# Patient Record
Sex: Male | Born: 2005 | Race: White | Hispanic: No | Marital: Single | Smoking: Never smoker
Health system: Southern US, Community
[De-identification: ages and names within clinical notes are randomized; demographics above are authoritative.]

## PROBLEM LIST (undated history)

## (undated) DIAGNOSIS — F909 Attention-deficit hyperactivity disorder, unspecified type: Secondary | ICD-10-CM

---
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
## 2023-12-14 ENCOUNTER — Other Ambulatory Visit: Payer: Self-pay

## 2023-12-14 ENCOUNTER — Emergency Department
Admission: EM | Admit: 2023-12-14 | Discharge: 2023-12-14 | Disposition: A | Discharge status: Home / Self Care | Attending: Emergency Medicine | Admitting: Emergency Medicine

## 2023-12-14 ENCOUNTER — Emergency Department

## 2023-12-14 DIAGNOSIS — S61213A Laceration without foreign body of left middle finger without damage to nail, initial encounter: Secondary | ICD-10-CM

## 2023-12-14 DIAGNOSIS — W293XXA Contact with powered garden and outdoor hand tools and machinery, initial encounter: Secondary | ICD-10-CM | POA: Diagnosis not present

## 2023-12-14 DIAGNOSIS — S61211A Laceration without foreign body of left index finger without damage to nail, initial encounter: Secondary | ICD-10-CM | POA: Insufficient documentation

## 2023-12-14 DIAGNOSIS — S62633B Displaced fracture of distal phalanx of left middle finger, initial encounter for open fracture: Secondary | ICD-10-CM | POA: Diagnosis not present

## 2023-12-14 DIAGNOSIS — Z23 Encounter for immunization: Secondary | ICD-10-CM | POA: Insufficient documentation

## 2023-12-14 DIAGNOSIS — T148XXA Other injury of unspecified body region, initial encounter: Secondary | ICD-10-CM

## 2023-12-14 MED ORDER — AMOXICILLIN-POT CLAVULANATE 875-125 MG PO TABS: 1.0000 | ORAL_TABLET | Freq: Two times a day (BID) | ORAL | 0 refills | Status: AC

## 2023-12-14 MED ORDER — LIDOCAINE HCL (PF) 1 % IJ SOLN
5.0000 mL | Freq: Once | INTRAMUSCULAR | Status: AC
  Administered 2023-12-14: 5 mL
  Filled 2023-12-14: qty 5

## 2023-12-14 MED ORDER — TETANUS-DIPHTH-ACELL PERTUSSIS 5-2.5-18.5 LF-MCG/0.5 IM SUSY
0.5000 mL | PREFILLED_SYRINGE | Freq: Once | INTRAMUSCULAR | Status: AC
  Administered 2023-12-14: 0.5 mL via INTRAMUSCULAR
  Filled 2023-12-14 (×2): qty 0.5

## 2023-12-14 MED ORDER — IBUPROFEN 600 MG PO TABS: 600.0000 mg | ORAL_TABLET | Freq: Three times a day (TID) | ORAL | 0 refills | Status: AC | PRN

## 2023-12-14 MED ORDER — AMOXICILLIN-POT CLAVULANATE 875-125 MG PO TABS
1.0000 | ORAL_TABLET | Freq: Once | ORAL | Status: AC
  Administered 2023-12-14: 1 via ORAL
  Filled 2023-12-14: qty 1

## 2023-12-14 MED ORDER — IBUPROFEN 600 MG PO TABS
600.0000 mg | ORAL_TABLET | Freq: Once | ORAL | Status: AC
  Administered 2023-12-14: 600 mg via ORAL
  Filled 2023-12-14: qty 1

## 2023-12-14 NOTE — ED Triage Notes (Signed)
 Pt to ED via POV c/o laceration to left middle finger. Pt was sawing something when he caught his finger. Pt has 2 lact to middle finger, not bleeding at this time. Pt also has small lac to index finger.
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
## 2023-12-14 NOTE — ED Provider Notes (Signed)
 [facility] Provider Note    Event Date/Time   First MD Initiated Contact with Patient 12/14/23 1949     (approximate)   History   Finger Injury    HPI  Kristopher Russell is a 18 y.o. male , with no significant past medical history who presents to the ED complaining of left middle finger laceration. According to the patient he was using chainsaw cutting wood and cut his finger.      Physical Exam   Triage Vital Signs: ED Triage Vitals  Encounter Vitals Group     BP 12/14/23 1936 (!) 144/90     Systolic BP Percentile --      Diastolic BP Percentile --      Pulse Rate 12/14/23 1936 93     Resp 12/14/23 1936 16     Temp 12/14/23 1936 (!) 97.5 F (36.4 C)     Temp src --      SpO2 12/14/23 1936 100 %     Weight 12/14/23 1935 185 lb (83.9 kg)     Height 12/14/23 1935 5\' 11"  (1.803 m)     Head Circumference --      Peak Flow --      Pain Score 12/14/23 1935 0     Pain Loc --      Pain Education --      Exclude from Growth Chart --     Most recent vital signs: Vitals:   12/14/23 1936  BP: (!) 144/90  Pulse: 93  Resp: 16  Temp: (!) 97.5 F (36.4 C)  SpO2: 100%     Constitutional: Alert, NAD. Able to speak in complete sentences without cough or dyspnea  Eyes: Conjunctivae are normal.  Head: Atraumatic. Nose: No congestion/rhinnorhea. Mouth/Throat: Mucous membranes are moist.   Neck: Painless ROM. Supple. No JVD, nodes, thyromegaly  Cardiovascular:   Good peripheral circulation.RRR no murmurs, gallops, rubs  Respiratory: Normal respiratory effort.  No retractions. Clear to auscultation bilaterally without wheezing or crackles  Gastrointestinal: Soft and nontender.  Musculoskeletal:  no deformity Left hand: Left middle finger: Presence of palmar laceration with avulsion of the skin the second phalange in the dorsal area, no active bleeding.  Second laceration medial area distal phalange.  Active bleeding Neurologic:  MAE spontaneously.  No gross focal neurologic deficits are appreciated.  Skin:  Skin is warm, dry and intact. No rash noted. Psychiatric: Mood and affect are normal. Speech and behavior are normal.    ED Results / Procedures / Treatments   Labs (all labs ordered are listed, but only abnormal results are displayed) Labs Reviewed - No data to display   EKG    RADIOLOGY I independently reviewed and interpreted imaging and agree with radiologists findings.      PROCEDURES:  Critical Care performed:   .Laceration Repair  Date/Time: 12/14/2023 10:31 PM  Performed by: Awilda Lennox, PA-C Authorized by: Awilda Lennox, PA-C   Consent:    Consent obtained:  Verbal   Consent given by:  Patient   Risks, benefits, and alternatives were discussed: yes     Risks discussed:  Infection and pain Universal protocol:    Procedure explained and questions answered to patient or proxy's satisfaction: yes     Patient identity confirmed:  Verbally with patient Anesthesia:    Anesthesia method:  Local infiltration and nerve block   Local anesthetic:  Lidocaine 1% WITH epi   Block needle gauge:  27 G   Block anesthetic:  Lidocaine 1% WITH epi   Block injection procedure:  Anatomic landmarks identified   Block outcome:  Anesthesia achieved Laceration details:    Location:  Finger   Length (cm):  5   Depth (mm):  5 Pre-procedure details:    Preparation:  Patient was prepped and draped in usual sterile fashion Exploration:    Hemostasis achieved with:  Direct pressure Treatment:    Area cleansed with:  Povidone-iodine   Amount of cleaning:  Standard   Irrigation solution:  Sterile saline   Irrigation method:  Pressure wash   Debridement:  None Skin repair:    Repair method:  Sutures   Suture size:  5-0   Suture material:  Nylon   Number of sutures:  6 Approximation:    Approximation:  Close Repair type:    Repair type:  Simple Post-procedure details:    Dressing:  Non-adherent dressing    Procedure completion:  Tolerated well, no immediate complications .Laceration Repair  Date/Time: 12/14/2023 10:34 PM  Performed by: Awilda Lennox, PA-C Authorized by: Awilda Lennox, PA-C   Consent:    Consent obtained:  Verbal   Consent given by:  Patient   Risks, benefits, and alternatives were discussed: yes     Risks discussed:  Infection and pain Universal protocol:    Procedure explained and questions answered to patient or proxy's satisfaction: yes     Patient identity confirmed:  Verbally with patient Anesthesia:    Anesthesia method:  None Laceration details:    Location:  Finger   Finger location:  L long finger   Length (cm):  1   Depth (mm):  2 Pre-procedure details:    Preparation:  Patient was prepped and draped in usual sterile fashion Exploration:    Limited defect created (wound extended): no     Hemostasis achieved with:  Direct pressure   Imaging outcome: foreign body not noted   Treatment:    Area cleansed with:  Povidone-iodine   Amount of cleaning:  Standard   Irrigation solution:  Sterile saline   Irrigation method:  Pressure wash Skin repair:    Repair method: Avulsion of skin. Post-procedure details:    Dressing:  Open (no dressing)    MEDICATIONS ORDERED IN ED: Medications  lidocaine (PF) (XYLOCAINE) 1 % injection 5 mL (5 mLs Other Given 12/14/23 2111)  Tdap (BOOSTRIX) injection 0.5 mL (0.5 mLs Intramuscular Given 12/14/23 2120)  ibuprofen (ADVIL) tablet 600 mg (600 mg Oral Given 12/14/23 2111)  amoxicillin-clavulanate (AUGMENTIN) 875-125 MG per tablet 1 tablet (1 tablet Oral Given 12/14/23 2111)   Clinical Course as of 12/14/23 2241  Sun Dec 14, 2023  2100 DG Finger Middle Left Soft tissue laceration and open fracture of the distal phalanx of the left third digit.   [AE]    Clinical Course User Index [AE] Awilda Lennox, PA-C    IMPRESSION / MDM / ASSESSMENT AND PLAN / ED COURSE  I reviewed the triage vital signs and the nursing  notes.  Differential diagnosis includes, but is not limited to, laceration, avulsion, fracture  Patient's presentation is most consistent with acute complicated illness / injury requiring diagnostic workup.   Patient's diagnosis is consistent with left middle finger laceration, open fracture of the distal phalanx. I independently reviewed and interpreted imaging and agree with radiologists findings.  I did review the patient's allergies and medications.During admission patient received Tdap, ibuprofen, Augmentin.  The patient is in stable and satisfactory condition for discharge home  Patient will be discharged  home with prescriptions for Augmentin, ibuprofen. Patient is to follow up with orthopedics as needed or otherwise directed. Patient is given ED precautions to return to the ED for any worsening or new symptoms. Discussed plan of care with patient, answered all of patient's questions, Patient agreeable to plan of care. Advised patient to take medications according to the instructions on the label. Discussed possible side effects of new medications. Patient verbalized understanding.    FINAL CLINICAL IMPRESSION(S) / ED DIAGNOSES   Final diagnoses:  Laceration of left middle finger without foreign body without damage to nail, initial encounter  Open fracture     Rx / DC Orders   ED Discharge Orders          Ordered    amoxicillin-clavulanate (AUGMENTIN) 875-125 MG tablet  2 times daily        12/14/23 2239    ibuprofen (ADVIL) 600 MG tablet  Every 8 hours PRN        12/14/23 2239             Note:  This document was prepared using Dragon voice recognition software and may include unintentional dictation errors.   Awilda Lennox, PA-C 12/14/23 2241    Collis Deaner, MD 12/15/23 [id number]

## 2023-12-14 NOTE — ED Notes (Signed)
 Written and verbal discharge instructions reviewed with patient, understanding verbalized, denied questions. Discharged from unit ambulatory in good condition with family.

## 2023-12-16 ENCOUNTER — Encounter: Attending: Physician Assistant | Admitting: Physician Assistant

## 2023-12-16 DIAGNOSIS — X58XXXA Exposure to other specified factors, initial encounter: Secondary | ICD-10-CM | POA: Diagnosis not present

## 2023-12-16 DIAGNOSIS — S61211A Laceration without foreign body of left index finger without damage to nail, initial encounter: Secondary | ICD-10-CM | POA: Insufficient documentation

## 2023-12-23 ENCOUNTER — Encounter: Admitting: Physician Assistant

## 2023-12-23 DIAGNOSIS — S61211A Laceration without foreign body of left index finger without damage to nail, initial encounter: Secondary | ICD-10-CM | POA: Diagnosis not present

## 2023-12-29 ENCOUNTER — Ambulatory Visit: Admitting: Physician Assistant

## 2023-12-30 ENCOUNTER — Encounter: Admitting: Physician Assistant

## 2023-12-30 DIAGNOSIS — S61211A Laceration without foreign body of left index finger without damage to nail, initial encounter: Secondary | ICD-10-CM | POA: Diagnosis not present
